# Patient Record
Sex: Male | Born: 1965 | Race: Black or African American | Hispanic: No | Smoking: Never smoker
Health system: Southern US, Community
[De-identification: ages and names within clinical notes are randomized; demographics above are authoritative.]

## PROBLEM LIST (undated history)

## (undated) DIAGNOSIS — I10 Essential (primary) hypertension: Secondary | ICD-10-CM

## (undated) DIAGNOSIS — E119 Type 2 diabetes mellitus without complications: Secondary | ICD-10-CM

## (undated) DIAGNOSIS — E785 Hyperlipidemia, unspecified: Secondary | ICD-10-CM

## (undated) DIAGNOSIS — E669 Obesity, unspecified: Secondary | ICD-10-CM

## (undated) HISTORY — PX: APPENDECTOMY: SHX54

---

## 2005-11-18 ENCOUNTER — Emergency Department: Payer: Self-pay | Admitting: Family Medicine

## 2006-04-29 ENCOUNTER — Ambulatory Visit: Payer: Self-pay | Admitting: General Practice

## 2007-06-11 ENCOUNTER — Ambulatory Visit: Payer: Self-pay | Admitting: General Practice

## 2008-08-31 ENCOUNTER — Emergency Department: Payer: Self-pay | Admitting: Emergency Medicine

## 2008-09-02 ENCOUNTER — Emergency Department: Payer: Self-pay | Admitting: Emergency Medicine

## 2010-05-03 ENCOUNTER — Observation Stay: Payer: Self-pay | Admitting: Surgery

## 2010-05-05 LAB — PATHOLOGY REPORT

## 2011-12-28 ENCOUNTER — Ambulatory Visit: Payer: Self-pay | Admitting: Internal Medicine

## 2012-06-23 ENCOUNTER — Ambulatory Visit: Payer: Self-pay | Admitting: Orthopedic Surgery

## 2013-01-29 ENCOUNTER — Ambulatory Visit: Payer: Self-pay | Admitting: Physical Medicine and Rehabilitation

## 2013-11-04 ENCOUNTER — Observation Stay: Payer: Self-pay | Admitting: Internal Medicine

## 2013-11-04 LAB — HEMOGLOBIN: HGB: 13.2 g/dL (ref 13.0–18.0)

## 2013-11-04 LAB — COMPREHENSIVE METABOLIC PANEL
ALBUMIN: 3.7 g/dL (ref 3.4–5.0)
ALK PHOS: 65 U/L
ALT: 31 U/L (ref 12–78)
Anion Gap: 4 — ABNORMAL LOW (ref 7–16)
BILIRUBIN TOTAL: 0.8 mg/dL (ref 0.2–1.0)
BUN: 17 mg/dL (ref 7–18)
CALCIUM: 9.2 mg/dL (ref 8.5–10.1)
Chloride: 100 mmol/L (ref 98–107)
Co2: 32 mmol/L (ref 21–32)
Creatinine: 1.03 mg/dL (ref 0.60–1.30)
EGFR (Non-African Amer.): >60
Glucose: 134 mg/dL — ABNORMAL HIGH (ref 65–99)
Osmolality: 275 (ref 275–301)
Potassium: 3.4 mmol/L — ABNORMAL LOW (ref 3.5–5.1)
SGOT(AST): 13 U/L — ABNORMAL LOW (ref 15–37)
SODIUM: 136 mmol/L (ref 136–145)
Total Protein: 7.5 g/dL (ref 6.4–8.2)

## 2013-11-04 LAB — URINALYSIS, COMPLETE
BACTERIA: NONE SEEN
Bilirubin,UR: NEGATIVE
Blood: NEGATIVE
Glucose,UR: 50 mg/dL (ref 0–75)
Leukocyte Esterase: NEGATIVE
NITRITE: NEGATIVE
Ph: 5 (ref 4.5–8.0)
Protein: 30
RBC,UR: 1 /HPF (ref 0–5)
SPECIFIC GRAVITY: 1.03 (ref 1.003–1.030)
Squamous Epithelial: NONE SEEN

## 2013-11-04 LAB — CBC WITH DIFFERENTIAL/PLATELET
Basophil #: 0.1 10*3/uL (ref 0.0–0.1)
Basophil %: 1.2 %
Eosinophil #: 0.2 10*3/uL (ref 0.0–0.7)
Eosinophil %: 1.8 %
HCT: 43.2 % (ref 40.0–52.0)
HGB: 14.2 g/dL (ref 13.0–18.0)
Lymphocyte #: 2.1 10*3/uL (ref 1.0–3.6)
Lymphocyte %: 23.9 %
MCH: 30.2 pg (ref 26.0–34.0)
MCHC: 32.8 g/dL (ref 32.0–36.0)
MCV: 92 fL (ref 80–100)
Monocyte #: 0.8 x10 3/mm (ref 0.2–1.0)
Monocyte %: 9.3 %
Neutrophil #: 5.7 10*3/uL (ref 1.4–6.5)
Neutrophil %: 63.8 %
PLATELETS: 324 10*3/uL (ref 150–440)
RBC: 4.7 10*6/uL (ref 4.40–5.90)
RDW: 14.5 % (ref 11.5–14.5)
WBC: 8.9 10*3/uL (ref 3.8–10.6)

## 2013-11-05 LAB — BASIC METABOLIC PANEL
Anion Gap: 6 — ABNORMAL LOW (ref 7–16)
BUN: 12 mg/dL (ref 7–18)
CALCIUM: 8.1 mg/dL — AB (ref 8.5–10.1)
CO2: 29 mmol/L (ref 21–32)
Chloride: 104 mmol/L (ref 98–107)
Creatinine: 1.04 mg/dL (ref 0.60–1.30)
EGFR (African American): >60
Glucose: 87 mg/dL (ref 65–99)
Osmolality: 277 (ref 275–301)
Potassium: 3.2 mmol/L — ABNORMAL LOW (ref 3.5–5.1)
Sodium: 139 mmol/L (ref 136–145)

## 2013-11-05 LAB — CBC WITH DIFFERENTIAL/PLATELET
BASOS PCT: 0.9 %
Basophil #: 0.1 10*3/uL (ref 0.0–0.1)
Eosinophil #: 0.3 10*3/uL (ref 0.0–0.7)
Eosinophil %: 4.1 %
HCT: 38.7 % — ABNORMAL LOW (ref 40.0–52.0)
HGB: 12.6 g/dL — ABNORMAL LOW (ref 13.0–18.0)
Lymphocyte #: 2.2 10*3/uL (ref 1.0–3.6)
Lymphocyte %: 30.5 %
MCH: 30.2 pg (ref 26.0–34.0)
MCHC: 32.6 g/dL (ref 32.0–36.0)
MCV: 93 fL (ref 80–100)
MONOS PCT: 9.7 %
Monocyte #: 0.7 x10 3/mm (ref 0.2–1.0)
NEUTROS PCT: 54.8 %
Neutrophil #: 3.9 10*3/uL (ref 1.4–6.5)
Platelet: 275 10*3/uL (ref 150–440)
RBC: 4.18 10*6/uL — ABNORMAL LOW (ref 4.40–5.90)
RDW: 14.3 % (ref 11.5–14.5)
WBC: 7.1 10*3/uL (ref 3.8–10.6)

## 2013-11-06 LAB — CBC WITH DIFFERENTIAL/PLATELET
Basophil #: 0 10*3/uL (ref 0.0–0.1)
Basophil %: 0.2 %
EOS ABS: 0.4 10*3/uL (ref 0.0–0.7)
Eosinophil %: 5.5 %
HCT: 38.3 % — ABNORMAL LOW (ref 40.0–52.0)
HGB: 12.6 g/dL — ABNORMAL LOW (ref 13.0–18.0)
Lymphocyte #: 1.6 10*3/uL (ref 1.0–3.6)
Lymphocyte %: 24.4 %
MCH: 30.1 pg (ref 26.0–34.0)
MCHC: 32.8 g/dL (ref 32.0–36.0)
MCV: 92 fL (ref 80–100)
MONOS PCT: 10.2 %
Monocyte #: 0.7 x10 3/mm (ref 0.2–1.0)
Neutrophil #: 4 10*3/uL (ref 1.4–6.5)
Neutrophil %: 59.7 %
Platelet: 289 10*3/uL (ref 150–440)
RBC: 4.17 10*6/uL — ABNORMAL LOW (ref 4.40–5.90)
RDW: 14 % (ref 11.5–14.5)
WBC: 6.6 10*3/uL (ref 3.8–10.6)

## 2013-11-06 LAB — POTASSIUM: Potassium: 3.3 mmol/L — ABNORMAL LOW (ref 3.5–5.1)

## 2013-11-06 LAB — PATHOLOGY REPORT

## 2014-11-27 NOTE — Discharge Summary (Signed)
Dates of Admission and Diagnosis:  Date of Admission 04-Nov-2013   Date of Discharge 06-Nov-2013   Admitting Diagnosis Hemetemesis   Final Diagnosis PUD with gastric/duodenal ulcers   Discharge Diagnosis 1 Barretts esophagus   2 Gi bleed   3 HTN   4 DM   5 Mild acute blood loss anemia   6 Hypokalemia    Chief Complaint/History of Present Illness CHIEF COMPLAINT: Melena.     HISTORY OF PRESENT ILLNESS: This is a very pleasant 49 year old male with history of hypertension, diabetes, who presents with the above complaint. Over the past week, the patient has had melena. Denies any hematochezia or coffee-ground emesis. He is also complaining of lower abdominal pain. In the ER, he had CT scan of the abdomen that showed no acute etiology. His hemoglobin is 14.2   Allergies:  No Known Allergies:   Routine Chem:  02-Apr-15 04:59   Potassium, Serum  3.2  Glucose, Serum 87  BUN 12  Creatinine (comp) 1.04  Sodium, Serum 139  CO2, Serum 29  Calcium (Total), Serum  8.1  Anion Gap  6  Osmolality (calc) 277  eGFR (African American) >60  eGFR (Non-African American) >60 (eGFR values <61mL/min/1.73 m2 may be an indication of chronic kidney disease (CKD). Calculated eGFR is useful in patients with stable renal function. The eGFR calculation will not be reliable in acutely ill patients when serum creatinine is changing rapidly. It is not useful in  patients on dialysis. The eGFR calculation may not be applicable to patients at the low and high extremes of body sizes, pregnant women, and vegetarians.)  03-Apr-15 04:55   Potassium, Serum  3.3 (Result(s) reported on 06 Nov 2013 at 05:32AM.)  Routine Hem:  02-Apr-15 04:59   WBC (CBC) 7.1  RBC (CBC)  4.18  Hemoglobin (CBC)  12.6  Hematocrit (CBC)  38.7  Platelet Count (CBC) 275  MCV 93  MCH 30.2  MCHC 32.6  RDW 14.3  Neutrophil % 54.8  Lymphocyte % 30.5  Monocyte % 9.7  Eosinophil % 4.1  Basophil % 0.9  Neutrophil # 3.9   Lymphocyte # 2.2  Monocyte # 0.7  Eosinophil # 0.3  Basophil # 0.1 (Result(s) reported on 05 Nov 2013 at [facility].)  03-Apr-15 04:55   WBC (CBC) 6.6  RBC (CBC)  4.17  Hemoglobin (CBC)  12.6  Hematocrit (CBC)  38.3  Platelet Count (CBC) 289  MCV 92  MCH 30.1  MCHC 32.8  RDW 14.0  Neutrophil % 59.7  Lymphocyte % 24.4  Monocyte % 10.2  Eosinophil % 5.5  Basophil % 0.2  Neutrophil # 4.0  Lymphocyte # 1.6  Monocyte # 0.7  Eosinophil # 0.4  Basophil # 0.0 (Result(s) reported on 06 Nov 2013 at 05:32AM.)   Pertinent Past History:  Pertinent Past History HTN DM   Hospital Course:  Hospital Course 49 yo male w/ hx of HTN, DM came into hospital due to abdominal pain and noted to have melanotic stools.   1. GI bleed - likely a slow upper GI bleed.  Pt. having melanotic stools.  - seen by GI and s/p endoscopy showing gastric/duodenal ulcers and also possible Barrett's esophogus.  - cont. PPI BID w/ Carafate.  - started on clear liquid diet and advanced - Hg. stable.   2. DM - cont. SSI, Actos.   3. HTN - cont. HCTZ, Norvasc.   4. Hypokalemia - cont. on potassium supplements.  Today on exam mild tenderness of abdomen. Lungs CTA S1,S2 No  edema   Condition on Discharge Fair   Code Status:  Code Status Full Code   DISCHARGE INSTRUCTIONS HOME MEDS:  Medication Reconciliation: Patient's Home Medications at Discharge:     Medication Instructions  amlodipine 10 mg oral tablet  1 tab(s) orally 2 times a day   metformin 1000 mg oral tablet  1 tab(s) orally once a day (in the morning) and 1.5 tablets (1500 mg) every evening   fish oil - oral capsule  1 cap(s) orally 2 times a day   hydrochlorothiazide 25 mg oral tablet  1 tab(s) orally once a day   pioglitazone 45 mg oral tablet  1 tab(s) orally once a day   sucralfate 1 g oral tablet  1 tab(s) orally 3 times a day (before meals)   pantoprazole 40 mg oral delayed release tablet  1 tab(s) orally 2 times a day   k-tab 20  meq oral tablet, extended release  1 tab(s) orally once a day    PRESCRIPTIONS: ELECTRONICALLY SUBMITTED  STOP TAKING THE FOLLOWING MEDICATION(S):    aspirin 81 mg oral tablet: 1 tab(s) orally once a day cinnamon: 1 cap(s) orally once a day  Physician's Instructions:  Diet Carbohydrate Controlled (ADA) Diet   Diet Consistency Regular Consistency   Activity Limitations As tolerated   Return to Work 2 weeks   Time frame for Follow Up Appointment 2-4 weeks  Dr. Vira Agar   Electronic Signatures: Darvin Neighbours, Lottie Dawson (MD)  (Signed 03-Apr-15 12:28)  Authored: ADMISSION DATE AND DIAGNOSIS, CHIEF COMPLAINT/HPI, Allergies, PERTINENT LABS, PERTINENT PAST HISTORY, HOSPITAL COURSE, [place], PATIENT INSTRUCTIONS   Last Updated: 03-Apr-15 12:28 by Alba Destine (MD)

## 2014-11-27 NOTE — H&P (Signed)
PATIENT NAME:  Jordan Ferrell, Jordan Ferrell MR#:  [id number] DATE OF BIRTH:  26-Sep-1965  DATE OF ADMISSION:  11/04/2013  PRIMARY CARE PHYSICIAN: Irven Easterly. Kary Kos, MD  CHIEF COMPLAINT: Melena.     HISTORY OF PRESENT ILLNESS: This is a very pleasant 49 year old male with history of hypertension, diabetes, who presents with the above complaint. Over the past week, the patient has had melena. Denies any hematochezia or coffee-ground emesis. He is also complaining of lower abdominal pain. In the ER, he had CT scan of the abdomen that showed no acute etiology. His hemoglobin is 14.2.   REVIEW OF SYSTEMS: CONSTITUTIONAL: No fever, fatigue, weakness.  EYES:  No blurred or double vision, glaucoma or cataracts.  ENT: No ear pain, hearing loss, seasonal allergies, postnasal drip, sinus pain.  RESPIRATORY: No cough, wheezing, hemoptysis, COPD, dyspnea.   CARDIOVASCULAR: No chest pain, palpitations, orthopnea, syncope, edema, arrhythmia, dyspnea on exertion.  GASTROINTESTINAL: Positive nausea. No vomiting. No diarrhea. Positive abdominal pain with poor appetite. Positive melena.  GENITOURINARY:  No dysuria or hematuria.  ENDOCRINE: No polyuria or polydipsia.  HEMATOLOGIC AND LYMPHATIC: No anemia or easy bruising.   SKIN: No rash or lesions.   MUSCULOSKELETAL: No pain in the shoulders or knees.  NEUROLOGICAL:  No history of CVA, TIA or seizures.  PSYCHIATRIC:  No history of anxiety or depression.   PAST MEDICAL HISTORY: 1.  Hypertension.  2.  Diabetes.   MEDICATIONS: 1.  Pioglitazone 40 mg daily.  2.  Metformin 1000 mg daily.  3.  HCTZ 25 mg daily. 4.  Fish oil 1 tablet b.i.d.  5.  Cinnamon 1 tablet daily.  6.  Aspirin 81 mg daily.  7.  Norvasc 10 mg daily.   ALLERGIES:  No known drug allergies.  FAMILY HISTORY: Positive for hypertension and diabetes.   SOCIAL HISTORY: No tobacco, occasional alcohol. No IV drug use.   PHYSICAL EXAMINATION: VITAL SIGNS: Temperature 98.5, pulse 73, respirations 20,  blood pressure 130/82, 99% on room air.  GENERAL: The patient is alert, oriented, not in acute distress.  HEENT:  Head is atraumatic. Pupils equal and reactive.  Sclerae anicteric.  Mucous membranes are moist.  Oropharynx is clear.   NECK: Supple without JVD, carotid bruit or enlarged thyroid.  CARDIOVASCULAR: Regular rate and rhythm. No murmurs, gallops or rubs. PMI is not displaced.  RESPIRATORY:  Lungs clear to auscultation without crackles, rales, rhonchi or wheezing. Normal percussion.  ABDOMEN: Bowel sounds are positive. Nontender, nondistended. No hepatosplenomegaly.  EXTREMITIES: No clubbing, cyanosis, edema.  NEURO:  Cranial nerves II through XII are intact. There are no focal deficits.  SKIN: Without rash or lesions.  BACK: No CVA or vertebral tenderness.  MUSCULOSKELETAL: The patient is able to move all extremities.   LABORATORY, DIAGNOSTIC AND RADIOLOGIC DATA:  1.  White blood cells 8.9, hemoglobin 14.2, hematocrit 43.2, platelets 324. Sodium 136, potassium 3.4, chloride 100, bicarb 32, BUN 17, creatinine 1.03, glucose 134, calcium 9.2, bilirubin 0.8, alk phos 65, ALT 31, AST 13, total protein 7.5, albumin 3.7.  2.  Urinalysis was negative for LCE or nitrites.  3.  Chest x-ray shows no acute cardiopulmonary findings.  4.  EKG shows normal sinus rhythm. No ST elevation or depression.   ASSESSMENT AND PLAN: A 49 year old male who presents with 1 week of melena, on daily aspirin.  1.  Melena. The patient is taking a daily aspirin, no other nonsteroidal anti-inflammatory drugs. The patient will need to have some luminal evaluation. I have consulted gastroenterology. The  patient is on a soft diet for now and n.p.o. after midnight. The patient's next hemoglobin is at 8:00 tonight. I have started him on Protonix 40 mg IV q.12 hours.  2.  Hypertension. We will continue his outpatient medications.  3.  Diabetes. The patient will be on sliding-scale insulin. We will continue pioglitazone,  holding metformin.  4.  Hypokalemia. Will replete and recheck in the a.m.  5.  The patient is full CODE STATUS.    TIME SPENT:  Approximately 45 minutes.     ____________________________ Donell Beers. Benjie Karvonen, MD spm:cs D: 11/04/2013 14:25:41 ET T: 11/04/2013 15:27:42 ET JOB#: [id number]  cc: Khaza Blansett P. Benjie Karvonen, MD, <Dictator> Irven Easterly. Kary Kos, MD Donell Beers Vianna Venezia MD ELECTRONICALLY SIGNED 11/04/2013 16:47

## 2014-11-27 NOTE — Consult Note (Signed)
Hgb 14 to 12.6.  Pt CC abd pain and melena.  EGD showed 3 duodenal ulcers, one small antral ulcer, diffuse patchy gastritis with superficial small erosions.  Maybe a short patch of Barretts esophagus. Biopsy of stomach for H. pylori and of distal esophagus for Barretts.  Would start clear liquids and advance to full liquids. PPI twice a day for a month then one a day.  Carafate one before each meal, soft food for several days.  probably home tomorrow if tol meds and oral intake. Follow up me in office in 2 weeks or 3.   Electronic Signatures: Scot JunElliott, Judia Arnott T (MD)  (Signed on 02-Apr-15 13:32)  Authored  Last Updated: 02-Apr-15 13:32 by Scot JunElliott, Mabry Tift T (MD)

## 2014-11-27 NOTE — Consult Note (Signed)
CC: duodenal and gastric ulcers, gastritis, duodenitis.  GI bleeding.  Pt doing better, no pain, no hematemesis.  Hgb stable at 12.6, K 3.3.  Advised to not work for a week due to ulcers with recent bleeding.  he can go home today on meds and bring papers to office to cover his work absense.  Electronic Signatures: Scot JunElliott, Robert T (MD)  (Signed on 03-Apr-15 12:36)  Authored  Last Updated: 03-Apr-15 12:36 by Scot JunElliott, Robert T (MD)

## 2014-11-27 NOTE — Consult Note (Signed)
Pt with pain in low periumbilical area, melena for 1-2 weeks, dry heaves off and on.  Plan EGD tomorrow,discussed with family and patient.  Electronic Signatures: Scot JunElliott, Brycelyn Gambino T (MD)  (Signed on 01-Apr-15 19:53)  Authored  Last Updated: 01-Apr-15 19:53 by Scot JunElliott, Marline Morace T (MD)

## 2014-11-27 NOTE — Consult Note (Signed)
I gave him a note for work until 4/13.  Electronic Signatures: Scot JunElliott, Teonia Yager T (MD)  (Signed on 02-Apr-15 13:48)  Authored  Last Updated: 02-Apr-15 13:48 by Scot JunElliott, Janiaya Ryser T (MD)

## 2014-11-27 NOTE — Consult Note (Signed)
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 He was seen in the ER, noted to have heme-positive stool. He had a hemoglobin of 14.2, had a CAT scan of the abdomen that showed a fatty liver, no other significant abnormality. I was asked to see him because of GI bleeding.   The patient took 1 dose of Pepto-Bismol 2 weeks ago, none since then. Denies any iron pills. Has had some fatigue lately and been nauseated for several days but no dysphagia.   PAST MEDICAL HISTORY: Hypertension since age 35, diabetes since 2002.   FAMILY HISTORY: Ulcers run in the family, mother and aunt with breast cancer.   MEDICATIONS ON ADMISSION:  Pioglitazone 40 mg a day, metformin 1000 mg a day, HCTZ 25 mg a day, fish oil 1 tablet b.i.d., cinnamon 1 tablet daily, aspirin 81 mg a day on an empty stomach, Norvasc 10 mg a day.   ALLERGIES: No known drug allergies.   SOCIAL HISTORY: Occasional alcohol, no tobacco.   The patient had a CAT scan in the ER as mentioned above.   PHYSICAL EXAMINATION: VITAL SIGNS: Temp 97.6, pulse 73, respirations 20, blood pressure 124/84.  GENERAL:  A large black male in no acute distress.  HEENT: Sclerae anicteric. Conjunctivae negative. Tongue negative.  HEAD: Atraumatic.  CHEST: Clear.  HEART: No murmurs or gallops I can hear.  ABDOMEN: No palpable hepatosplenomegaly. No significant tenderness. No peritoneal signs.  SKIN: Warm and dry.  PSYCHIATRIC: Mood and affect are appropriate.   LABORATORY, DIAGNOSTIC AND RADIOLOGICAL DATA:  Glucose 134, BUN 17, creatinine 1.03, sodium 136, potassium 3.4,  chloride 100, CO2 32, calcium 9.2, total protein 7.5, albumin 3.7, total bili 0.8, alk phos 65, SGOT 13, SGPT 31. White count 8.9, hemoglobin 14.2, platelet count 324. Urinalysis shows a small amount of protein and glucose in the urine. CAT scan of the abdomen again shows a fatty liver.   ASSESSMENT AND PLAN: The patient has a history of melena for a week with dry heaves at times, takes aspirin 81 mg on an empty stomach. Significant possibility of peptic ulcer disease here. Plan to do an upper endoscopy tomorrow morning. We will go from there if the endoscopy is negative.  ____________________________ Manya Silvas, MD rte:cs D: 11/04/2013 19:52:57 ET T: 11/04/2013 20:14:54 ET JOB#: [id number]  cc: Manya Silvas, MD, <Dictator> Manya Silvas MD ELECTRONICALLY SIGNED 11/16/2013 14:17

## 2015-12-14 IMAGING — CT CT ABD-PELV W/ CM
2 of 5 series · 17 of 46 positions shown, 19 images · IV contrast (isovue)
Comparison: 05/02/2010

CLINICAL DATA: Black stools and abdominal pain. Decreased appetite.

EXAM:
CT ABDOMEN AND PELVIS WITH CONTRAST
TECHNIQUE: Multidetector CT imaging of the abdomen and pelvis was performed
using the standard protocol following bolus administration of
intravenous contrast.
CONTRAST:  125 mL of Isovue 370 intravenous contrast.

[Series 2: routine abd pel with · axial · 0.93mm/px · z∈[-1183,-703]mm · 14 of 109 slices shown, 16 images]
[im 7/109  soft-tissue]
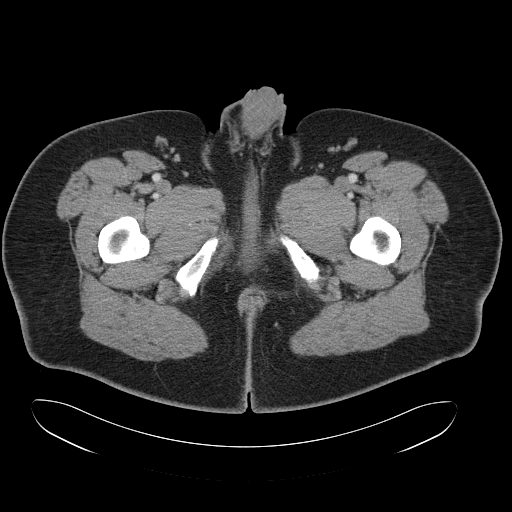
[im 7/109  bone]
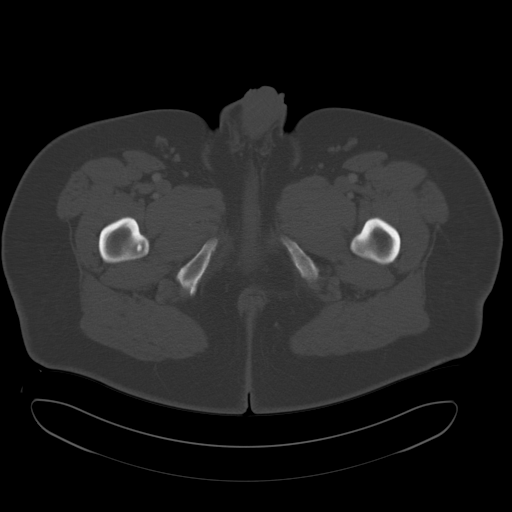
[im 13/109  soft-tissue]
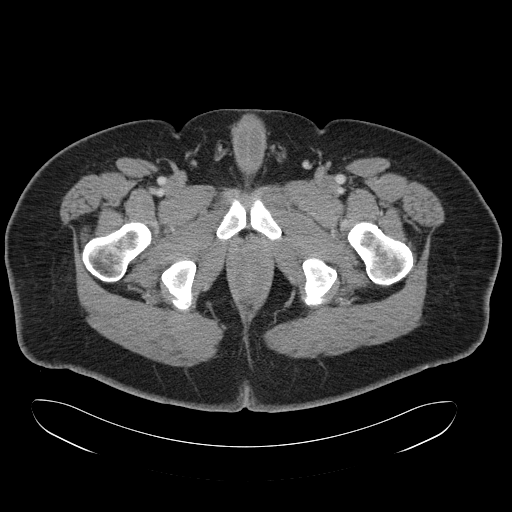
[im 25/109  soft-tissue]
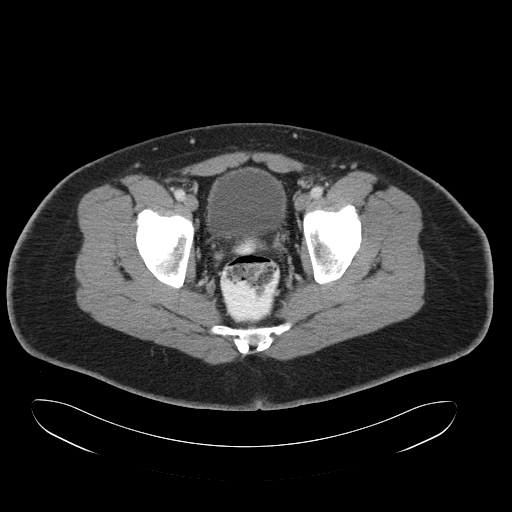
[im 31/109  soft-tissue]
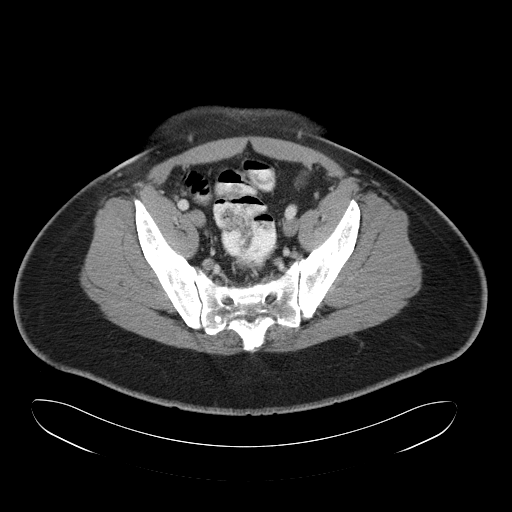
[im 37/109  soft-tissue]
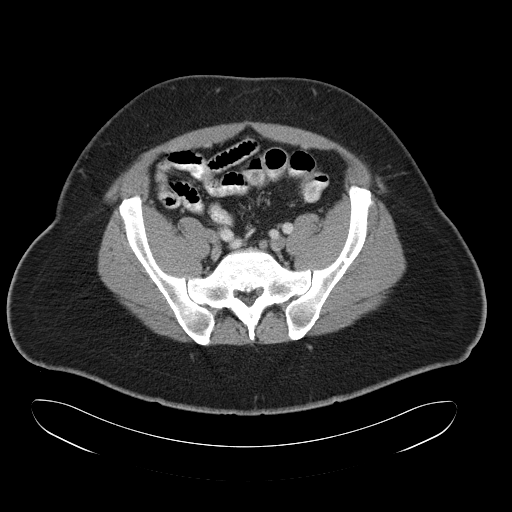
[im 43/109  soft-tissue]
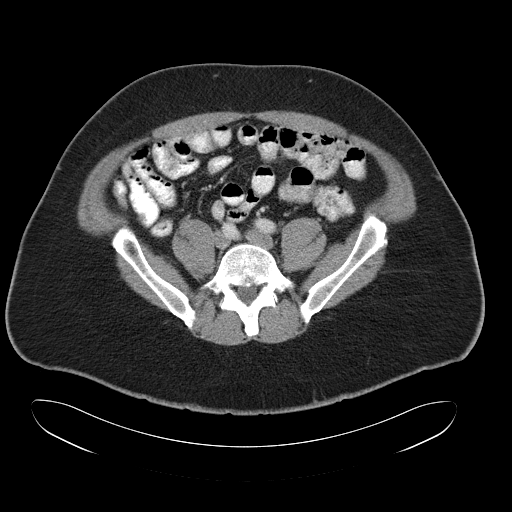
[im 49/109  soft-tissue]
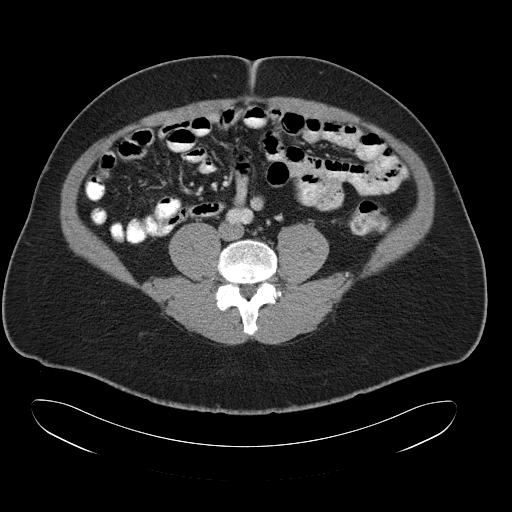
[im 61/109  soft-tissue]
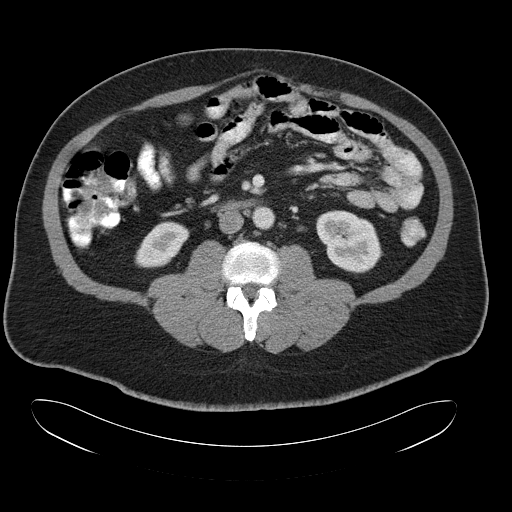
[im 67/109  soft-tissue]
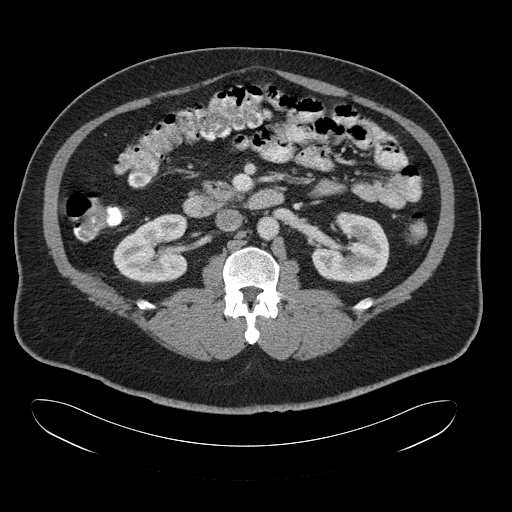
[im 67/109  bone]
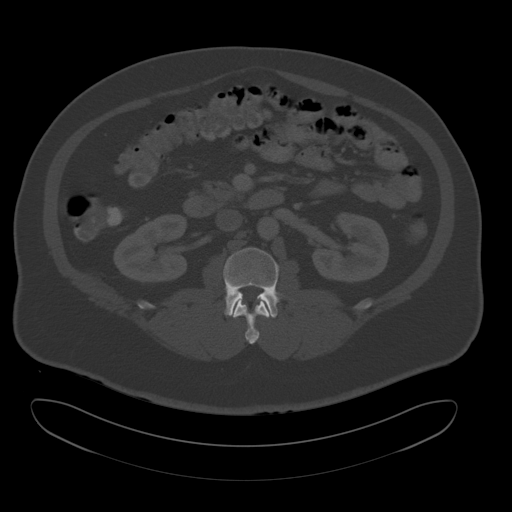
[im 73/109  soft-tissue]
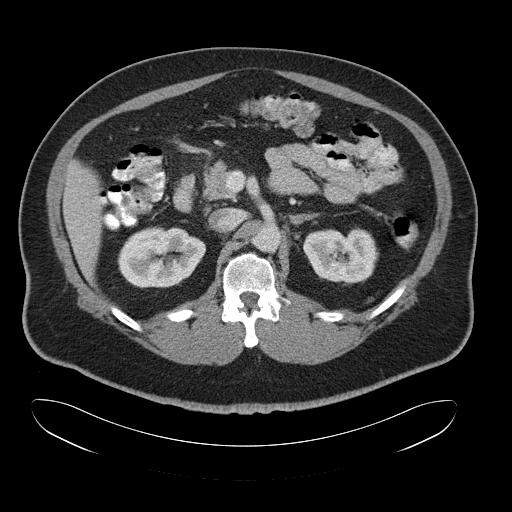
[im 79/109  soft-tissue]
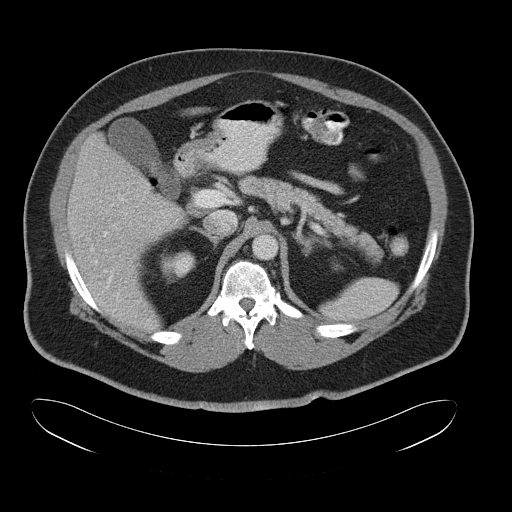
[im 85/109  soft-tissue]
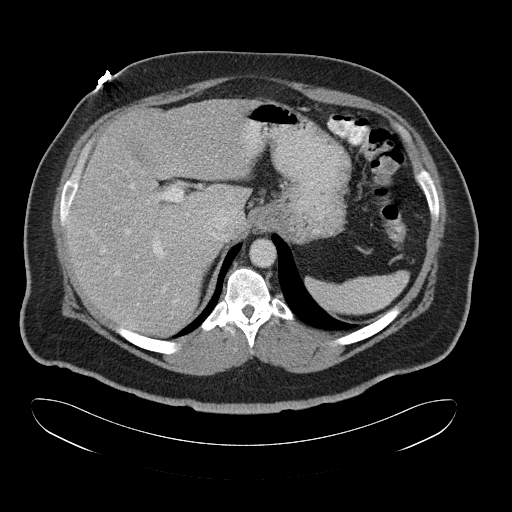
[im 97/109  soft-tissue]
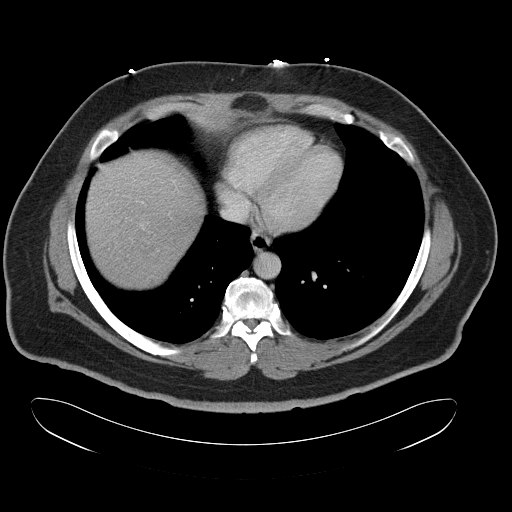
[im 103/109  soft-tissue]
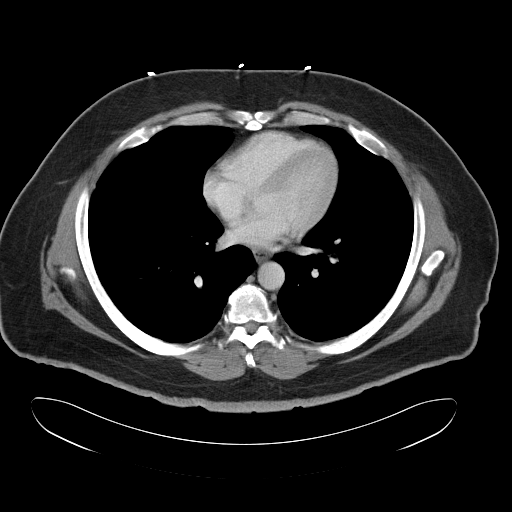

[Series 6: cor routine abd pel with · coronal · 1.01mm/px · 3 of 144 slices shown]
[im 48/144  soft-tissue]
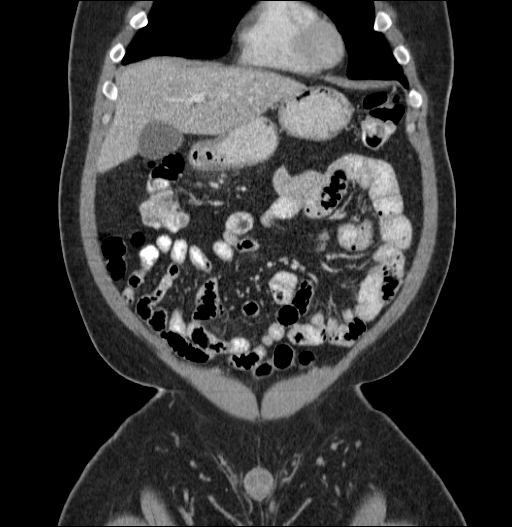
[im 64/144  soft-tissue]
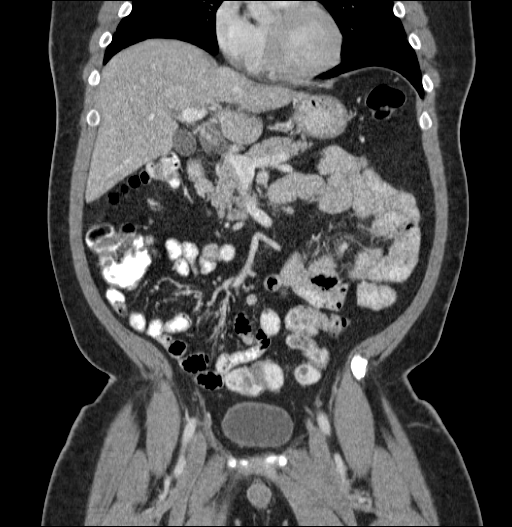
[im 80/144  soft-tissue]
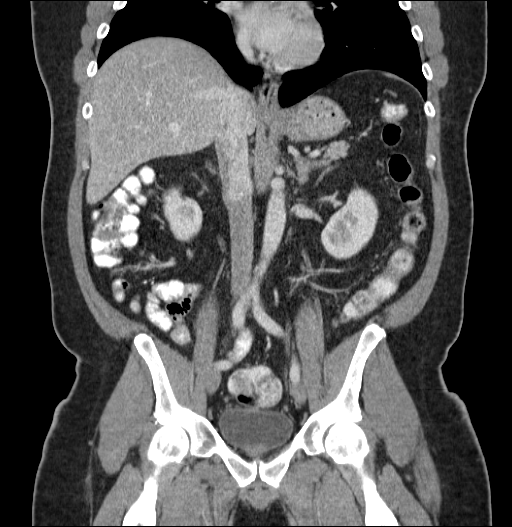

[17 of 46 positions shown; findings below may reference images not displayed]

FINDINGS: Clear lung bases.  Heart is normal in size.

There are shows fatty infiltration.  No liver mass or focal lesion.

Normal spleen, gallbladder and pancreas. No bile duct dilation. No
adrenal masses. Normal kidneys, ureters and bladder. No
pathologically enlarged lymph nodes. There are no abnormal fluid
collections.

The colon is unremarkable. No diverticulitis seen. There is no wall
thickening and there are no masses. The stomach and small bowel are
unremarkable.

Degenerative changes are noted of the visualized spine. No
osteoblastic or osteolytic lesions.
IMPRESSION: 1. No acute findings.
2. No bowel abnormality.  No findings to explain GI bleeding.
3. Hepatic steatosis. Degenerative changes noted of the visualized
spine.

## 2020-04-21 ENCOUNTER — Other Ambulatory Visit
Admission: RE | Admit: 2020-04-21 | Discharge: 2020-04-21 | Disposition: A | Discharge status: Home / Self Care | Payer: BC Managed Care – PPO | Source: Ambulatory Visit | Attending: Internal Medicine | Admitting: Internal Medicine

## 2020-04-21 ENCOUNTER — Other Ambulatory Visit: Payer: Self-pay

## 2020-04-21 DIAGNOSIS — Z01812 Encounter for preprocedural laboratory examination: Secondary | ICD-10-CM | POA: Diagnosis not present

## 2020-04-21 DIAGNOSIS — Z20822 Contact with and (suspected) exposure to covid-19: Secondary | ICD-10-CM | POA: Insufficient documentation

## 2020-04-22 ENCOUNTER — Other Ambulatory Visit: Payer: BC Managed Care – PPO

## 2020-04-22 ENCOUNTER — Encounter: Payer: Self-pay | Admitting: Internal Medicine

## 2020-04-22 LAB — SARS CORONAVIRUS 2 (TAT 6-24 HRS): SARS Coronavirus 2: NEGATIVE

## 2020-04-25 ENCOUNTER — Ambulatory Visit
Admission: RE | Admit: 2020-04-25 | Discharge: 2020-04-25 | Disposition: A | Discharge status: Home / Self Care | Payer: BC Managed Care – PPO | Attending: Internal Medicine | Admitting: Internal Medicine

## 2020-04-25 ENCOUNTER — Ambulatory Visit: Payer: BC Managed Care – PPO | Admitting: Anesthesiology

## 2020-04-25 ENCOUNTER — Encounter: Payer: Self-pay | Admitting: Internal Medicine

## 2020-04-25 ENCOUNTER — Encounter
Admission: RE | Disposition: A | Discharge status: Home / Self Care | Payer: Self-pay | Source: Home / Self Care | Attending: Internal Medicine

## 2020-04-25 DIAGNOSIS — Z7984 Long term (current) use of oral hypoglycemic drugs: Secondary | ICD-10-CM | POA: Insufficient documentation

## 2020-04-25 DIAGNOSIS — Z7951 Long term (current) use of inhaled steroids: Secondary | ICD-10-CM | POA: Insufficient documentation

## 2020-04-25 DIAGNOSIS — Z6841 Body Mass Index (BMI) 40.0 and over, adult: Secondary | ICD-10-CM | POA: Diagnosis not present

## 2020-04-25 DIAGNOSIS — E785 Hyperlipidemia, unspecified: Secondary | ICD-10-CM | POA: Insufficient documentation

## 2020-04-25 DIAGNOSIS — Z1211 Encounter for screening for malignant neoplasm of colon: Secondary | ICD-10-CM | POA: Diagnosis present

## 2020-04-25 DIAGNOSIS — K640 First degree hemorrhoids: Secondary | ICD-10-CM | POA: Insufficient documentation

## 2020-04-25 DIAGNOSIS — I10 Essential (primary) hypertension: Secondary | ICD-10-CM | POA: Diagnosis not present

## 2020-04-25 DIAGNOSIS — E669 Obesity, unspecified: Secondary | ICD-10-CM | POA: Diagnosis not present

## 2020-04-25 DIAGNOSIS — Z79899 Other long term (current) drug therapy: Secondary | ICD-10-CM | POA: Insufficient documentation

## 2020-04-25 DIAGNOSIS — E119 Type 2 diabetes mellitus without complications: Secondary | ICD-10-CM | POA: Diagnosis not present

## 2020-04-25 DIAGNOSIS — Z888 Allergy status to other drugs, medicaments and biological substances status: Secondary | ICD-10-CM | POA: Diagnosis not present

## 2020-04-25 HISTORY — DX: Obesity, unspecified: E66.9

## 2020-04-25 HISTORY — PX: COLONOSCOPY: SHX5424

## 2020-04-25 HISTORY — DX: Type 2 diabetes mellitus without complications: E11.9

## 2020-04-25 HISTORY — DX: Essential (primary) hypertension: I10

## 2020-04-25 HISTORY — DX: Hyperlipidemia, unspecified: E78.5

## 2020-04-25 LAB — GLUCOSE, CAPILLARY: Glucose-Capillary: 100 mg/dL — ABNORMAL HIGH (ref 70–99)

## 2020-04-25 SURGERY — COLONOSCOPY
Anesthesia: General

## 2020-04-25 MED ORDER — MIDAZOLAM HCL 2 MG/2ML IJ SOLN
INTRAMUSCULAR | Status: AC
  Filled 2020-04-25: qty 2

## 2020-04-25 MED ORDER — PROPOFOL 10 MG/ML IV BOLUS
INTRAVENOUS | Status: DC | PRN
  Administered 2020-04-25: 30 mg via INTRAVENOUS
  Administered 2020-04-25: 50 mg via INTRAVENOUS
  Administered 2020-04-25: 30 mg via INTRAVENOUS
  Administered 2020-04-25: 20 mg via INTRAVENOUS

## 2020-04-25 MED ORDER — MIDAZOLAM HCL 5 MG/5ML IJ SOLN
INTRAMUSCULAR | Status: DC | PRN
  Administered 2020-04-25: 2 mg via INTRAVENOUS

## 2020-04-25 MED ORDER — SODIUM CHLORIDE 0.9 % IV SOLN: INTRAVENOUS | Status: DC

## 2020-04-25 MED ORDER — FENTANYL CITRATE (PF) 100 MCG/2ML IJ SOLN
INTRAMUSCULAR | Status: DC | PRN
Start: 2020-04-25 — End: 2020-04-25
  Administered 2020-04-25 (×2): 50 ug via INTRAVENOUS

## 2020-04-25 MED ORDER — PROPOFOL 500 MG/50ML IV EMUL
INTRAVENOUS | Status: AC
  Filled 2020-04-25: qty 50

## 2020-04-25 MED ORDER — PROPOFOL 500 MG/50ML IV EMUL
INTRAVENOUS | Status: DC | PRN
  Administered 2020-04-25: 120 ug/kg/min via INTRAVENOUS

## 2020-04-25 MED ORDER — FENTANYL CITRATE (PF) 100 MCG/2ML IJ SOLN
INTRAMUSCULAR | Status: AC
  Filled 2020-04-25: qty 2

## 2020-04-25 MED ORDER — LIDOCAINE HCL (PF) 2 % IJ SOLN
INTRAMUSCULAR | Status: AC
  Filled 2020-04-25: qty 5

## 2020-04-25 MED ORDER — LIDOCAINE HCL (PF) 2 % IJ SOLN
INTRAMUSCULAR | Status: DC | PRN
  Administered 2020-04-25: 25 mg

## 2020-04-25 NOTE — Anesthesia Preprocedure Evaluation (Signed)
Anesthesia Evaluation  Patient identified by MRN, date of birth, ID band Patient awake    Reviewed: Allergy & Precautions, NPO status , Patient's Chart, lab work & pertinent test results  History of Anesthesia Complications Negative for: history of anesthetic complications  Airway Mallampati: III  TM Distance: >3 FB Neck ROM: Full    Dental no notable dental hx. (+) Teeth Intact   Pulmonary neg pulmonary ROS, neg sleep apnea, neg COPD, Patient abstained from smoking.Not current smoker,    Pulmonary exam normal breath sounds clear to auscultation       Cardiovascular Exercise Tolerance: Good METShypertension, (-) CAD and (-) Past MI (-) dysrhythmias  Rhythm:Regular Rate:Normal - Systolic murmurs    Neuro/Psych negative neurological ROS  negative psych ROS   GI/Hepatic neg GERD  ,(+)     (-) substance abuse  ,   Endo/Other  diabetesMorbid obesity  Renal/GU negative Renal ROS     Musculoskeletal   Abdominal (+) + obese,   Peds  Hematology   Anesthesia Other Findings Past Medical History: No date: Diabetes mellitus without complication (HCC) No date: Hyperlipidemia No date: Hypertension No date: Obesity  Reproductive/Obstetrics                             Anesthesia Physical Anesthesia Plan  ASA: III  Anesthesia Plan: General   Post-op Pain Management:    Induction: Intravenous  PONV Risk Score and Plan: 2 and Ondansetron, Propofol infusion and TIVA  Airway Management Planned: Nasal Cannula  Additional Equipment: None  Intra-op Plan:   Post-operative Plan:   Informed Consent: I have reviewed the patients History and Physical, chart, labs and discussed the procedure including the risks, benefits and alternatives for the proposed anesthesia with the patient or authorized representative who has indicated his/her understanding and acceptance.     Dental advisory  given  Plan Discussed with: CRNA and Surgeon  Anesthesia Plan Comments: (Discussed risks of anesthesia with patient, including possibility of difficulty with spontaneous ventilation under anesthesia necessitating airway intervention, PONV, and rare risks such as cardiac or respiratory or neurological events. Patient understands. Patient informed about increased incidence of above perioperative risk due to high BMI. Patient understands. )        Anesthesia Quick Evaluation

## 2020-04-25 NOTE — Anesthesia Postprocedure Evaluation (Signed)
Anesthesia Post Note  Patient: Jordan Ferrell  Procedure(s) Performed: COLONOSCOPY (N/A )  Patient location during evaluation: Endoscopy Anesthesia Type: General Level of consciousness: awake and alert Pain management: pain level controlled Vital Signs Assessment: post-procedure vital signs reviewed and stable Respiratory status: spontaneous breathing, nonlabored ventilation, respiratory function stable and patient connected to nasal cannula oxygen Cardiovascular status: blood pressure returned to baseline and stable Postop Assessment: no apparent nausea or vomiting Anesthetic complications: no   No complications documented.   Last Vitals:  Vitals:   04/25/20 1400 04/25/20 1406  BP: (!) 147/88   Pulse: 68 61  Resp: 16 15  Temp:    SpO2: 100% 100%    Last Pain:  Vitals:   04/25/20 1247  TempSrc: Temporal  PainSc: 0-No pain                 Corinda Gubler

## 2020-04-25 NOTE — Interval H&P Note (Signed)
History and Physical Interval Note:  04/25/2020 1:13 PM  Jordan Ferrell  has presented today for surgery, with the diagnosis of COLON CANCER SCREEN.  The various methods of treatment have been discussed with the patient and family. After consideration of risks, benefits and other options for treatment, the patient has consented to  Procedure(s): COLONOSCOPY (N/A) as a surgical intervention.  The patient's history has been reviewed, patient examined, no change in status, stable for surgery.  I have reviewed the patient's chart and labs.  Questions were answered to the patient's satisfaction.     [place], [place]

## 2020-04-25 NOTE — Op Note (Signed)
[facility] Gastroenterology Patient Name: Jordan Ferrell Procedure Date: 04/25/2020 1:06 PM MRN: [id number] Account #: [id number] Date of Birth: [birth year] Admit Type: Outpatient Age: 54 Room: [facility] ENDO ROOM 3 Gender: Male Note Status: Finalized Procedure:             Colonoscopy Indications:           Screening for colorectal malignant neoplasm Providers:             Boykin Nearing. Ezra Denne MD, MD Medicines:             Propofol per Anesthesia Complications:         No immediate complications. Procedure:             Pre-Anesthesia Assessment:                        - The risks and benefits of the procedure and the                         sedation options and risks were discussed with the                         patient. All questions were answered and informed                         consent was obtained.                        - Patient identification and proposed procedure were                         verified prior to the procedure by the nurse. The                         procedure was verified in the procedure room.                        - ASA Grade Assessment: III - A patient with severe                         systemic disease.                        - After reviewing the risks and benefits, the patient                         was deemed in satisfactory condition to undergo the                         procedure.                        After obtaining informed consent, the colonoscope was                         passed under direct vision. Throughout the procedure,                         the patient's blood pressure, pulse, and oxygen  saturations were monitored continuously. The                         Colonoscope was introduced through the anus and                         advanced to the the cecum, identified by appendiceal                         orifice and ileocecal valve. The colonoscopy was                         performed  without difficulty. The patient tolerated                         the procedure well. The quality of the bowel                         preparation was good. The ileocecal valve, appendiceal                         orifice, and rectum were photographed. Findings:      The perianal and digital rectal examinations were normal. Pertinent       negatives include normal sphincter tone and no palpable rectal lesions.      Non-bleeding internal hemorrhoids were found during retroflexion. The       hemorrhoids were Grade I (internal hemorrhoids that do not prolapse).      The entire examined colon appeared normal. Impression:            - Non-bleeding internal hemorrhoids.                        - The entire examined colon is normal.                        - No specimens collected. Recommendation:        - Patient has a contact number available for                         emergencies. The signs and symptoms of potential                         delayed complications were discussed with the patient.                         Return to normal activities tomorrow. Written                         discharge instructions were provided to the patient.                        - Resume previous diet.                        - Continue present medications.                        - Repeat colonoscopy in 10 years for screening  purposes.                        - Return to GI office PRN.                        - The findings and recommendations were discussed with                         the patient. Procedure Code(s):     --- Professional ---                        W4315, Colorectal cancer screening; colonoscopy on                         individual not meeting criteria for high risk Diagnosis Code(s):     --- Professional ---                        K64.0, First degree hemorrhoids                        Z12.11, Encounter for screening for malignant neoplasm                         of  colon CPT copyright 2019 [employer]. All rights reserved. The codes documented in this report are preliminary and upon coder review may  be revised to meet current compliance requirements. Stanton Kidney MD, MD 04/25/2020 1:48:18 PM This report has been signed electronically. Number of Addenda: 0 Note Initiated On: 04/25/2020 1:06 PM Scope Withdrawal Time: 0 hours 15 minutes 22 seconds  Total Procedure Duration: 0 hours 18 minutes 10 seconds  Estimated Blood Loss:  Estimated blood loss: none.      [facility]

## 2020-04-25 NOTE — Transfer of Care (Signed)
Immediate Anesthesia Transfer of Care Note  Patient: Jordan Ferrell  Procedure(s) Performed: COLONOSCOPY (N/A )  Patient Location: PACU  Anesthesia Type:General  Level of Consciousness: sedated  Airway & Oxygen Therapy: Patient Spontanous Breathing and Patient connected to nasal cannula oxygen  Post-op Assessment: Report given to RN and Post -op Vital signs reviewed and stable  Post vital signs: Reviewed and stable  Last Vitals:  Vitals Value Taken Time  BP 129/76 04/25/20 1350  Temp    Pulse 75 04/25/20 1350  Resp 14 04/25/20 1350  SpO2 98 % 04/25/20 1350    Last Pain:  Vitals:   04/25/20 1247  TempSrc: Temporal  PainSc: 0-No pain         Complications: No complications documented.

## 2020-04-25 NOTE — H&P (Signed)
Outpatient short stay form Pre-procedure 04/25/2020 1:03 PM Mckensi Redinger K. Norma Fredrickson, M.D.  Primary Physician: Jerl Mina, M.D.  Reason for visit:  Colon cancer screening  History of present illness: Patient presents for colonoscopy for colon cancer screening. The patient denies complaints of abdominal pain, significant change in bowel habits, or rectal bleeding.      Current Facility-Administered Medications:    0.9 %  sodium chloride infusion, , Intravenous, Continuous, Gloria Lambertson, Boykin Nearing, MD  Medications Prior to Admission  Medication Sig Dispense Refill Last Dose   Albuterol Sulfate (PROAIR RESPICLICK) 108 (90 Base) MCG/ACT AEPB Inhale into the lungs.   Past Month at Unknown time   Cinnamon Bark POWD by Does not apply route.   Past Week at Unknown time   dapagliflozin propanediol (FARXIGA) 10 MG TABS tablet Take by mouth daily.   Past Week at Unknown time   Dulaglutide (TRULICITY) 1.5 MG/0.5ML SOPN Inject into the skin.   Past Week at Unknown time   fexofenadine (ALLEGRA) 180 MG tablet Take 180 mg by mouth daily.   Past Week at Unknown time   fish oil-omega-3 fatty acids 1000 MG capsule Take 2 g by mouth daily.   Past Week at Unknown time   hydrALAZINE (APRESOLINE) 50 MG tablet Take 50 mg by mouth 3 (three) times daily.   Past Week at Unknown time   hydrochlorothiazide (HYDRODIURIL) 25 MG tablet Take 25 mg by mouth daily.   Past Week at Unknown time   losartan (COZAAR) 100 MG tablet Take 100 mg by mouth daily.   Past Week at Unknown time   metFORMIN (GLUCOPHAGE-XR) 750 MG 24 hr tablet Take 750 mg by mouth daily with breakfast.   Past Week at Unknown time   metoprolol succinate (TOPROL-XL) 100 MG 24 hr tablet Take 100 mg by mouth daily. Take with or immediately following a meal.   Past Week at Unknown time   pioglitazone (ACTOS) 45 MG tablet Take 45 mg by mouth daily.   Past Week at Unknown time   levocetirizine (XYZAL) 5 MG tablet Take 5 mg by mouth every evening. (Patient  not taking: Reported on 04/25/2020)   Completed Course at Unknown time     Allergies  Allergen Reactions   Altace [Ramipril]     Unknown      Past Medical History:  Diagnosis Date   Diabetes mellitus without complication (HCC)    Hyperlipidemia    Hypertension    Obesity     Review of systems:  Otherwise negative.    Physical Exam  Gen: Alert, oriented. Appears stated age.  HEENT: [place]/AT. PERRLA. Lungs: CTA, no wheezes. CV: RR nl S1, S2. Abd: soft, benign, no masses. BS+ Ext: No edema. Pulses 2+    Planned procedures: Proceed with colonoscopy. The patient understands the nature of the planned procedure, indications, risks, alternatives and potential complications including but not limited to bleeding, infection, perforation, damage to internal organs and possible oversedation/side effects from anesthesia. The patient agrees and gives consent to proceed.  Please refer to procedure notes for findings, recommendations and patient disposition/instructions.     Shanelle Clontz K. Norma Fredrickson, M.D. Gastroenterology 04/25/2020  1:03 PM

## 2020-04-27 ENCOUNTER — Other Ambulatory Visit: Payer: BC Managed Care – PPO

## 2020-04-27 ENCOUNTER — Other Ambulatory Visit: Payer: Self-pay

## 2020-04-27 DIAGNOSIS — Z20822 Contact with and (suspected) exposure to covid-19: Secondary | ICD-10-CM

## 2020-04-29 LAB — NOVEL CORONAVIRUS, NAA: SARS-CoV-2, NAA: NOT DETECTED

## 2020-04-29 LAB — SPECIMEN STATUS REPORT

## 2020-04-29 LAB — SARS-COV-2, NAA 2 DAY TAT

## 2020-06-02 ENCOUNTER — Other Ambulatory Visit: Payer: BC Managed Care – PPO

## 2020-06-02 DIAGNOSIS — Z20822 Contact with and (suspected) exposure to covid-19: Secondary | ICD-10-CM

## 2020-06-03 LAB — SARS-COV-2, NAA 2 DAY TAT

## 2020-06-03 LAB — NOVEL CORONAVIRUS, NAA: SARS-CoV-2, NAA: NOT DETECTED
